# Patient Record
Sex: Male | Born: 1975 | Race: White | Hispanic: No | Marital: Single | Smoking: Never smoker
Health system: Southern US, Community
[De-identification: ages and names within clinical notes are randomized; demographics above are authoritative.]

## PROBLEM LIST (undated history)

## (undated) DIAGNOSIS — I10 Essential (primary) hypertension: Secondary | ICD-10-CM

---

## 2019-09-25 ENCOUNTER — Encounter (HOSPITAL_BASED_OUTPATIENT_CLINIC_OR_DEPARTMENT_OTHER): Payer: Self-pay | Admitting: *Deleted

## 2019-09-25 ENCOUNTER — Emergency Department (HOSPITAL_BASED_OUTPATIENT_CLINIC_OR_DEPARTMENT_OTHER)
Admission: EM | Admit: 2019-09-25 | Discharge: 2019-09-25 | Disposition: A | Discharge status: Home / Self Care | Payer: Self-pay | Attending: Emergency Medicine | Admitting: Emergency Medicine

## 2019-09-25 ENCOUNTER — Other Ambulatory Visit: Payer: Self-pay

## 2019-09-25 ENCOUNTER — Emergency Department (HOSPITAL_BASED_OUTPATIENT_CLINIC_OR_DEPARTMENT_OTHER): Payer: Self-pay

## 2019-09-25 DIAGNOSIS — R079 Chest pain, unspecified: Secondary | ICD-10-CM | POA: Insufficient documentation

## 2019-09-25 DIAGNOSIS — R0789 Other chest pain: Secondary | ICD-10-CM | POA: Insufficient documentation

## 2019-09-25 DIAGNOSIS — R0609 Other forms of dyspnea: Secondary | ICD-10-CM

## 2019-09-25 DIAGNOSIS — I10 Essential (primary) hypertension: Secondary | ICD-10-CM | POA: Insufficient documentation

## 2019-09-25 HISTORY — DX: Essential (primary) hypertension: I10

## 2019-09-25 LAB — CBC
HCT: 41.3 % (ref 39.0–52.0)
Hemoglobin: 13.2 g/dL (ref 13.0–17.0)
MCH: 29.9 pg (ref 26.0–34.0)
MCHC: 32 g/dL (ref 30.0–36.0)
MCV: 93.4 fL (ref 80.0–100.0)
Platelets: 256 10*3/uL (ref 150–400)
RBC: 4.42 MIL/uL (ref 4.22–5.81)
RDW: 12.4 % (ref 11.5–15.5)
WBC: 6.4 10*3/uL (ref 4.0–10.5)
nRBC: 0 % (ref 0.0–0.2)

## 2019-09-25 LAB — BASIC METABOLIC PANEL
Anion gap: 7 (ref 5–15)
BUN: 24 mg/dL — ABNORMAL HIGH (ref 6–20)
CO2: 26 mmol/L (ref 22–32)
Calcium: 8.8 mg/dL — ABNORMAL LOW (ref 8.9–10.3)
Chloride: 104 mmol/L (ref 98–111)
Creatinine, Ser: 1.11 mg/dL (ref 0.61–1.24)
GFR calc Af Amer: >60 mL/min (ref >60)
GFR calc non Af Amer: >60 mL/min (ref >60)
Glucose, Bld: 118 mg/dL — ABNORMAL HIGH (ref 70–99)
Potassium: 4.1 mmol/L (ref 3.5–5.1)
Sodium: 137 mmol/L (ref 135–145)

## 2019-09-25 LAB — TROPONIN I (HIGH SENSITIVITY)
Troponin I (High Sensitivity): 8 ng/L (ref <18)
Troponin I (High Sensitivity): 7 ng/L (ref <18)

## 2019-09-25 MED ORDER — SODIUM CHLORIDE 0.9% FLUSH
3.0000 mL | Freq: Once | INTRAVENOUS | Status: DC
  Filled 2019-09-25: qty 3

## 2019-09-25 MED ORDER — ALBUTEROL SULFATE HFA 108 (90 BASE) MCG/ACT IN AERS
4.0000 | INHALATION_SPRAY | Freq: Once | RESPIRATORY_TRACT | Status: AC
  Administered 2019-09-25: 21:00:00 4 via RESPIRATORY_TRACT
  Filled 2019-09-25: qty 6.7

## 2019-09-25 NOTE — ED Provider Notes (Signed)
[facility] EMERGENCY DEPARTMENT Provider Note   CSN: [id number] Arrival date & time: 09/25/19  1850  History    Chief Complaint  Patient presents with  . Chest Pain  . Shortness of Breath    HPI Patrick Hanson is a 43 y.o. male with PMHx s/f Hypertension, who presents to the ED with chest pain and shortness of breath.  Patient reports that shortness of breath started 4 days ago on 09/22/2019.  He reports that he has had worsening dyspnea on exertion and first noticed when he was winded walking up the stairs.  He reports after he is short of breath he has some midsternal chest pain that goes away with rest.  Patient does report some radiation to bilateral jaws.  Today, he checked his blood pressure and it was 573 systolic, which she says is much higher than it typically is.. Patient was following with his PCP very closely, about monthly, until couple months ago.  He is currently on 10 mg of lisinopril for high blood pressure.  He reports that this this dose of lisinopril has managed his blood pressure well.  He denies any fevers, chills, nausea, vomiting, diaphoresis, abdominal pain, constipation/diarrhea.  He does not have any history of high cholesterol.  Patient does report some fatigue recently.  Allergies: Penicillins Medications:  Current Facility-Administered Medications:  .  sodium chloride flush (NS) 0.9 % injection 3 mL, 3 mL, Intravenous, Once, Fredia Sorrow, MD  Current Outpatient Medications:  .  lisinopril (ZESTRIL) 10 MG tablet, TAKE 1 TABLET BY MOUTH ONCE DAILY., Disp: , Rfl:  .  traZODone (DESYREL) 50 MG tablet, TAKE 3 TABLETS BY MOUTH AT BEDTIME, Disp: , Rfl:    Past Medical/Surgical History Past Medical History:  Diagnosis Date  . Hypertension    There are no active problems to display for this patient.  History reviewed. No pertinent surgical history.    No family history on file. Social History   Tobacco Use  . Smoking status: Never Smoker  .  Smokeless tobacco: Never Used  Substance Use Topics  . Alcohol use: Not Currently  . Drug use: Never   Review of Systems Review of Systems  Constitutional: Positive for fatigue. Negative for chills, diaphoresis and fever.  HENT: Positive for dental problem. Negative for ear pain and sore throat.   Eyes: Negative for pain and visual disturbance.  Respiratory: Positive for chest tightness and shortness of breath. Negative for cough.   Cardiovascular: Negative for chest pain and palpitations.  Gastrointestinal: Negative for abdominal pain, constipation, diarrhea, nausea and vomiting.  Genitourinary: Negative for dysuria and hematuria.  Musculoskeletal: Negative for arthralgias, back pain and gait problem.  Skin: Negative for color change and rash.  Neurological: Positive for headaches. Negative for dizziness, seizures, syncope and light-headedness.  All other systems reviewed and are negative.   Physical Exam Updated Vital Signs BP 136/86   Pulse 68   Temp 98.5 F (36.9 C) (Oral)   Resp 16   Ht 5\' 11"  (1.803 m)   Wt (!) 147.4 kg   SpO2 98%   BMI 45.33 kg/m   Physical Exam  ED Treatments / Results  Labs (all labs ordered are listed, but only abnormal results are displayed) Labs Reviewed  BASIC METABOLIC PANEL - Abnormal; Notable for the following components:      Result Value   Glucose, Bld 118 (*)    BUN 24 (*)    Calcium 8.8 (*)    All other components within normal  limits  CBC  TROPONIN I (HIGH SENSITIVITY)  TROPONIN I (HIGH SENSITIVITY)    EKG EKG Interpretation  Date/Time:  Thursday September 25 2019 19:00:07 EDT Ventricular Rate:  73 PR Interval:  172 QRS Duration: 96 QT Interval:  390 QTC Calculation: 429 R Axis:   -23 Text Interpretation:  Normal sinus rhythm Cannot rule out Anterior infarct , age undetermined Abnormal ECG Artifact Confirmed by Vanetta MuldersZackowski, Scott [phone]([id number]) on 09/25/2019 8:12:40 PM   Radiology Dg Chest 2 View  Result Date: 09/25/2019  CLINICAL DATA:  Short of breath for 4 days. Chest pain. EXAM: CHEST - 2 VIEW COMPARISON:  None. FINDINGS: Both views are degraded by patient body habitus. Lateral view degraded by patient arm position. Midline trachea. Mild cardiomegaly. No pleural effusion or pneumothorax. Mild interstitial prominence is felt to be due to relatively low lung volumes on the frontal radiograph. No well-defined lobar consolidation. IMPRESSION: 1. Mild cardiomegaly, without congestive failure. 2. Mild degradation secondary to patient body habitus. Electronically Signed   By: Jeronimo GreavesKyle  Talbot M.D.   On: 09/25/2019 19:40    Procedures Procedures (including critical care time)  Medications Ordered in ED Medications  sodium chloride flush (NS) 0.9 % injection 3 mL (3 mLs Intravenous Not Given 09/25/19 1923)  albuterol (VENTOLIN HFA) 108 (90 Base) MCG/ACT inhaler 4 puff (4 puffs Inhalation Given 09/25/19 2044)    Initial Impression / Assessment and Plan / ED Course  I have reviewed the triage vital signs and the nursing notes. Pertinent labs & imaging results that were available during my care of the patient were reviewed by me and considered in my medical decision making (see chart for details). Patient's presentation consistent with possible ACS given chest pain and shortness of breath on exertion and relief with rest.  Patient's risk factors include obesity, hypertension.  His last lipid panel was within normal limits in December 2019.  Patient's heart score is 3.  Initial work-up shows normal BMP and CBC, troponin of 8 and chest x-ray with low lung volumes.  Patient does report that he does have some audible wheezing but has never been diagnosed with asthma in the past.  Will trial for possible albuterol to see if there is any improvement with breathing.  It is unlikely that patient has shortness of breath due to infectious etiology as he does not have any cough, URI symptoms or fever.  10:24 PM Patient's physical exam  improved with albuterol.  I do not hear any wheezing.  He subjectively feels a little bit better.  But he typically has had shortness of breath when he is moving around.  Patient second troponin came back negative.  Patient has not had any chest pain since coming to the ED.  Patient's blood pressures have also improved to 130s over 70s.  Encourage patient to follow-up with PCP for further work-up.  If albuterol helps to improve patient's breathing on exertion, he should follow-up for further evaluation of asthma.   Patient reports having difficulty scheduling with PCP as he had currently has no insurance since he was laid off due to Covid.  [facility].    Final Clinical Impressions(s) / ED Diagnoses   Final diagnoses:  Dyspnea on exertion  Chest pain in adult   ED Discharge Orders    None     Disposition: home w/ follow up with PCP  Melene Planachel E. Areana Kosanke, M.D. FM PGY-2  Melene Plan, MD 09/25/19 2224    Vanetta Mulders, MD 09/25/19 2227

## 2019-09-25 NOTE — ED Notes (Signed)
Pt. Reports he has felt a tightness in his chest and jaw pain in the past week with relief and then off and on pain again.  Pt. Reports shob with exertion needing to rest 5-10 mins before going again.

## 2019-09-25 NOTE — ED Triage Notes (Signed)
Sob x 3 days. Tightness in his chest today. Fatigue and HTN.

## 2019-09-25 NOTE — Discharge Instructions (Addendum)
Please follow up with your primary care provider for today's emergency visit.  According to your labs and imaging today, it does not appear that you had heart attack.  However, given your risk factors of high blood pressure and obesity, this does not rule out further cardiac disease in the future.   Remember to try to use your albuterol when you feel short of breath to see if it has any improvement.  If it does, you should follow-up with your doctor for possible asthma evaluation. If you experience any continued chest pain, vomiting, diffuse sweating, please return to the emergency department for further work-up.

## 2020-05-13 MED FILL — PHENTERMINE 37.5 MG TABLET: 37.5 | 30 days supply | Qty: 30 | Fill #0

## 2020-05-13 MED FILL — TOPIRAMATE 25 MG TAB: 25 | 30 days supply | Qty: 30 | Fill #0

## 2020-06-10 MED FILL — PHENTERMINE 37.5 MG TABLET: 37.5 | 30 days supply | Qty: 30 | Fill #0

## 2020-07-08 MED FILL — PHENTERMINE 37.5 MG TABLET: 37.5 | 30 days supply | Qty: 30 | Fill #0

## 2021-03-29 ENCOUNTER — Emergency Department (HOSPITAL_BASED_OUTPATIENT_CLINIC_OR_DEPARTMENT_OTHER): Payer: Self-pay

## 2021-03-29 ENCOUNTER — Other Ambulatory Visit: Payer: Self-pay

## 2021-03-29 ENCOUNTER — Emergency Department (HOSPITAL_BASED_OUTPATIENT_CLINIC_OR_DEPARTMENT_OTHER)
Admission: EM | Admit: 2021-03-29 | Discharge: 2021-03-29 | Disposition: A | Discharge status: Home / Self Care | Payer: Self-pay | Attending: Emergency Medicine | Admitting: Emergency Medicine

## 2021-03-29 ENCOUNTER — Encounter (HOSPITAL_BASED_OUTPATIENT_CLINIC_OR_DEPARTMENT_OTHER): Payer: Self-pay | Admitting: *Deleted

## 2021-03-29 DIAGNOSIS — R197 Diarrhea, unspecified: Secondary | ICD-10-CM

## 2021-03-29 DIAGNOSIS — Z79899 Other long term (current) drug therapy: Secondary | ICD-10-CM | POA: Insufficient documentation

## 2021-03-29 DIAGNOSIS — R599 Enlarged lymph nodes, unspecified: Secondary | ICD-10-CM

## 2021-03-29 DIAGNOSIS — R110 Nausea: Secondary | ICD-10-CM

## 2021-03-29 DIAGNOSIS — M545 Low back pain, unspecified: Secondary | ICD-10-CM | POA: Insufficient documentation

## 2021-03-29 DIAGNOSIS — I10 Essential (primary) hypertension: Secondary | ICD-10-CM | POA: Insufficient documentation

## 2021-03-29 DIAGNOSIS — R109 Unspecified abdominal pain: Secondary | ICD-10-CM

## 2021-03-29 DIAGNOSIS — R10814 Left lower quadrant abdominal tenderness: Secondary | ICD-10-CM | POA: Insufficient documentation

## 2021-03-29 LAB — DIFFERENTIAL
Abs Immature Granulocytes: 0.03 10*3/uL (ref 0.00–0.07)
Basophils Absolute: 0 10*3/uL (ref 0.0–0.1)
Basophils Relative: 0 %
Eosinophils Absolute: 0.1 10*3/uL (ref 0.0–0.5)
Eosinophils Relative: 1 %
Immature Granulocytes: 0 %
Lymphocytes Relative: 25 %
Lymphs Abs: 2.2 10*3/uL (ref 0.7–4.0)
Monocytes Absolute: 0.6 10*3/uL (ref 0.1–1.0)
Monocytes Relative: 7 %
Neutro Abs: 5.7 10*3/uL (ref 1.7–7.7)
Neutrophils Relative %: 67 %

## 2021-03-29 LAB — CBC
HCT: 43.2 % (ref 39.0–52.0)
Hemoglobin: 14.1 g/dL (ref 13.0–17.0)
MCH: 29 pg (ref 26.0–34.0)
MCHC: 32.6 g/dL (ref 30.0–36.0)
MCV: 88.7 fL (ref 80.0–100.0)
Platelets: 327 10*3/uL (ref 150–400)
RBC: 4.87 MIL/uL (ref 4.22–5.81)
RDW: 13.1 % (ref 11.5–15.5)
WBC: 8.4 10*3/uL (ref 4.0–10.5)
nRBC: 0 % (ref 0.0–0.2)

## 2021-03-29 LAB — COMPREHENSIVE METABOLIC PANEL
ALT: 74 U/L — ABNORMAL HIGH (ref 0–44)
AST: 36 U/L (ref 15–41)
Albumin: 3.8 g/dL (ref 3.5–5.0)
Alkaline Phosphatase: 86 U/L (ref 38–126)
Anion gap: 10 (ref 5–15)
BUN: 13 mg/dL (ref 6–20)
CO2: 24 mmol/L (ref 22–32)
Calcium: 8.5 mg/dL — ABNORMAL LOW (ref 8.9–10.3)
Chloride: 102 mmol/L (ref 98–111)
Creatinine, Ser: 0.84 mg/dL (ref 0.61–1.24)
GFR, Estimated: >60 mL/min (ref >60)
Glucose, Bld: 116 mg/dL — ABNORMAL HIGH (ref 70–99)
Potassium: 3.7 mmol/L (ref 3.5–5.1)
Sodium: 136 mmol/L (ref 135–145)
Total Bilirubin: 0.2 mg/dL — ABNORMAL LOW (ref 0.3–1.2)
Total Protein: 7.3 g/dL (ref 6.5–8.1)

## 2021-03-29 LAB — URINALYSIS, ROUTINE W REFLEX MICROSCOPIC
Bilirubin Urine: NEGATIVE
Glucose, UA: NEGATIVE mg/dL
Hgb urine dipstick: NEGATIVE
Ketones, ur: NEGATIVE mg/dL
Leukocytes,Ua: NEGATIVE
Nitrite: NEGATIVE
Protein, ur: NEGATIVE mg/dL
Specific Gravity, Urine: >1.03 — ABNORMAL HIGH (ref 1.005–1.030)
pH: 6 (ref 5.0–8.0)

## 2021-03-29 LAB — LIPASE, BLOOD: Lipase: 44 U/L (ref 11–51)

## 2021-03-29 MED ORDER — DICYCLOMINE HCL 20 MG PO TABS: 20.0000 mg | ORAL_TABLET | Freq: Two times a day (BID) | ORAL | 0 refills | Status: AC | PRN

## 2021-03-29 MED ORDER — ONDANSETRON HCL 4 MG PO TABS: 4.0000 mg | ORAL_TABLET | Freq: Three times a day (TID) | ORAL | 0 refills | Status: AC | PRN

## 2021-03-29 MED ORDER — IOHEXOL 300 MG/ML  SOLN
100.0000 mL | Freq: Once | INTRAMUSCULAR | Status: AC | PRN
  Administered 2021-03-29: 100 mL via INTRAVENOUS

## 2021-03-29 MED ORDER — MORPHINE SULFATE (PF) 4 MG/ML IV SOLN
4.0000 mg | Freq: Once | INTRAVENOUS | Status: AC
  Administered 2021-03-29: 4 mg via INTRAVENOUS
  Filled 2021-03-29: qty 1

## 2021-03-29 MED ORDER — SODIUM CHLORIDE 0.9 % IV BOLUS
1000.0000 mL | Freq: Once | INTRAVENOUS | Status: AC
  Administered 2021-03-29: 1000 mL via INTRAVENOUS

## 2021-03-29 MED ORDER — ONDANSETRON HCL 4 MG/2ML IJ SOLN
4.0000 mg | Freq: Once | INTRAMUSCULAR | Status: AC
  Administered 2021-03-29: 4 mg via INTRAVENOUS
  Filled 2021-03-29: qty 2

## 2021-03-29 NOTE — ED Notes (Signed)
Patient transported to CT 

## 2021-03-29 NOTE — ED Provider Notes (Signed)
[facility] EMERGENCY DEPARTMENT Provider Note   CSN: [id number] Arrival date & time: 03/29/21  1906     History Chief Complaint  Patient presents with  . Back Pain  . Emesis    Patrick Hanson is a 45 y.o. male presenting for evaluation of nausea, abdominal pain, diarrhea.  Patient states that the past week he has had persistent diarrhea.  Approximately 10 episodes a day.  He has had associated nausea, but no vomiting.  He reports chills.  Over the past 4 days, he has felt worse, stating he is feeling fatigued and overall weak.  He is also now having intermittent sharp spasm pain, mostly in the left side of his abdomen.  He denies history of similar.  No history of abdominal problems, has had hernia repair surgery but no other abdominal surgeries.  He denies chest pain, shortness of breath, cough, urinary symptoms.  No blood in his stool.  He is not taking anything for his symptoms.  He has a history of hypertension and anxiety, no other medical problems.  HPI     Past Medical History:  Diagnosis Date  . Hypertension     There are no problems to display for this patient.   History reviewed. No pertinent surgical history.     No family history on file.  Social History   Tobacco Use  . Smoking status: Never Smoker  . Smokeless tobacco: Never Used  Substance Use Topics  . Alcohol use: Not Currently  . Drug use: Never    Home Medications Prior to Admission medications   Medication Sig Start Date End Date Taking? Authorizing Provider  citalopram (CELEXA) 10 MG tablet Take by mouth. 09/22/20  Yes [provider]  dicyclomine (BENTYL) 20 MG tablet Take 1 tablet (20 mg total) by mouth 2 (two) times daily as needed for spasms. 03/29/21  Yes Shakeira Rhee, PA-C  lisinopril (ZESTRIL) 10 MG tablet TAKE 1 TABLET BY MOUTH ONCE DAILY. 08/22/17  Yes [provider]  ondansetron (ZOFRAN) 4 MG tablet Take 1 tablet (4 mg total) by mouth every 8 (eight)  hours as needed for nausea or vomiting. 03/29/21  Yes Shah Insley, PA-C  traZODone (DESYREL) 50 MG tablet TAKE 3 TABLETS BY MOUTH AT BEDTIME 06/27/18  Yes [provider]    Allergies    Penicillins  Review of Systems   Review of Systems  Gastrointestinal: Positive for abdominal pain, diarrhea and nausea.  Neurological: Positive for weakness.  All other systems reviewed and are negative.   Physical Exam Updated Vital Signs BP (!) 146/101   Pulse 75   Temp 98.4 F (36.9 C) (Oral)   Resp 18   Ht 5\' 11"  (1.803 m)   Wt (!) 163.3 kg   SpO2 98%   BMI 50.21 kg/m   Physical Exam Vitals and nursing note reviewed.  Constitutional:      General: He is not in acute distress.    Appearance: He is well-developed. He is obese.     Comments: Resting in the bed in no acute distress  HENT:     Head: Normocephalic and atraumatic.  Eyes:     Conjunctiva/sclera: Conjunctivae normal.     Pupils: Pupils are equal, round, and reactive to light.  Cardiovascular:     Rate and Rhythm: Normal rate and regular rhythm.     Pulses: Normal pulses.  Pulmonary:     Effort: Pulmonary effort is normal. No respiratory distress.     Breath sounds: Normal  breath sounds. No wheezing.  Abdominal:     General: There is no distension.     Palpations: Abdomen is soft. There is no mass.     Tenderness: There is abdominal tenderness. There is no guarding or rebound.     Comments: Tenderness palpation of left lower quadrant abdomen.  No rigidity, guarding, syndrome.  Negative rebound.  No peritonitis.  No CVA tenderness.  Musculoskeletal:        General: Tenderness present. Normal range of motion.     Cervical back: Normal range of motion and neck supple.     Comments: Mild tenderness palpation of right low back musculature.  No pain over midline spine.  Skin:    General: Skin is warm and dry.     Capillary Refill: Capillary refill takes less than 2 seconds.  Neurological:     Mental Status:  He is alert and oriented to person, place, and time.     ED Results / Procedures / Treatments   Labs (all labs ordered are listed, but only abnormal results are displayed) Labs Reviewed  COMPREHENSIVE METABOLIC PANEL - Abnormal; Notable for the following components:      Result Value   Glucose, Bld 116 (*)    Calcium 8.5 (*)    ALT 74 (*)    Total Bilirubin 0.2 (*)    All other components within normal limits  URINALYSIS, ROUTINE W REFLEX MICROSCOPIC - Abnormal; Notable for the following components:   Specific Gravity, Urine >1.030 (*)    All other components within normal limits  LIPASE, BLOOD  CBC  DIFFERENTIAL    EKG None  Radiology CT ABDOMEN PELVIS W CONTRAST  Result Date: 03/29/2021 CLINICAL DATA:  Diverticulitis suspected. Abdominal pain and back pain with vomiting for a week. EXAM: CT ABDOMEN AND PELVIS WITH CONTRAST TECHNIQUE: Multidetector CT imaging of the abdomen and pelvis was performed using the standard protocol following bolus administration of intravenous contrast. CONTRAST:  OMNIPAQUE IOHEXOL 300 MG/ML  SOLN COMPARISON:  None. FINDINGS: Lower chest: No acute abnormality. Hepatobiliary: No focal liver abnormality. No gallstones, gallbladder wall thickening, or pericholecystic fluid. No biliary dilatation. Pancreas: Question peripancreatic lymph node versus exophytic pancreatic lesion arising from the pancreatic head (2:34). Normal pancreatic contour. No surrounding inflammatory changes. No main pancreatic ductal dilatation. Spleen: Normal in size without focal abnormality. Adrenals/Urinary Tract: No adrenal nodule bilaterally. Bilateral kidneys enhance symmetrically. No hydronephrosis. No hydroureter. The urinary bladder is unremarkable. Stomach/Bowel: Stomach is within normal limits. No evidence of bowel wall thickening or dilatation. Appendix appears normal. Vascular/Lymphatic: No abdominal aorta or iliac aneurysm. No abdominal, pelvic, or inguinal  lymphadenopathy. Reproductive: Prostate is unremarkable. Other: No intraperitoneal free fluid. No intraperitoneal free gas. No organized fluid collection. Musculoskeletal: Fat containing left inguinal hernia. No suspicious lytic or blastic osseous lesions. No acute displaced fracture. Multilevel degenerative changes of the spine. IMPRESSION: Couple of enlarged upper abdominal lymph nodes with subjacent vague misty mesentery surrounding the superior mesenteric vessels. The peripancreatic lymph node could also represent an exophytic pancreatic lesion. Concern for metastatic malignancy or lymphoma. Electronically Signed   By: Tish Frederickson M.D.   On: 03/29/2021 21:47    Procedures Procedures   Medications Ordered in ED Medications  sodium chloride 0.9 % bolus 1,000 mL (0 mLs Intravenous Stopped 03/29/21 2120)  ondansetron (ZOFRAN) injection 4 mg (4 mg Intravenous Given 03/29/21 2014)  morphine 4 MG/ML injection 4 mg (4 mg Intravenous Given 03/29/21 2014)  iohexol (OMNIPAQUE) 300 MG/ML solution 100 mL (100  mLs Intravenous Contrast Given 03/29/21 2119)    ED Course  I have reviewed the triage vital signs and the nursing notes.  Pertinent labs & imaging results that were available during my care of the patient were reviewed by me and considered in my medical decision making (see chart for details).    MDM Rules/Calculators/A&P                          Patient presenting for evaluation of nausea, abdominal pain, diarrhea.  On exam, patient appears nontoxic.  He does have tenderness palpation of the left lower quadrant.  Concern for possible diverticulitis.  Also consider viral GI illness.  Less likely kidney stone, patient without CVA tenderness.  Less likely UTI, patient without urinary symptoms.  Will obtain labs, CT abdomen pelvis, treat symptomatically, and reassess.  Labs interpreted by me, overall reassuring.  Kidney, liver, pancreatic function normal.  No leukocytosis.  Electrolytes stable.   CT abdomen pelvis negative for acute findings other than possible enlarged lymph nodes.  In the setting of what is likely a viral GI illness, this is likely reactive lymphadenopathy.  However discussed findings with patient and importance of close follow-up with PCP to ensure resolution of lymphadenopathy.  On reassessment, patient reports significant improvement of symptoms with medications.  I discussed continued symptomatic treatment at home.  At this time, patient appears safe for discharge.  Return precautions given.  Patient states he understands and agrees to plan.   Final Clinical Impression(s) / ED Diagnoses Final diagnoses:  Diarrhea, unspecified type  Nausea  Intermittent abdominal pain  Enlarged lymph node    Rx / DC Orders ED Discharge Orders         Ordered    ondansetron (ZOFRAN) 4 MG tablet  Every 8 hours PRN        03/29/21 2304    dicyclomine (BENTYL) 20 MG tablet  2 times daily PRN        03/29/21 2304           Itzy Adler, PA-C 03/29/21 2309    Gwyneth Sprout, MD 03/29/21 2335

## 2021-03-29 NOTE — ED Triage Notes (Signed)
Abdominal pain, back pain and vomiting for a week.

## 2021-03-29 NOTE — ED Notes (Signed)
Pt placed in a gown and hooked up to the monitor with the BP cuff and pulse ox 

## 2021-03-29 NOTE — Discharge Instructions (Addendum)
Use Zofran as needed for nausea or vomiting. Use Bentyl as needed for abdominal pain/spasm/cramp. Use Tylenol or ibuprofen as needed for further pain control. Make sure you are staying well-hydrated with water. There is information in the paperwork about foods that may help and hurt your diarrhea. It is important that you follow-up with your primary care doctor for recheck of your symptoms and for repeat evaluation of the enlarged lymph nodes that were identified today. Return to the emergency room if develop high fevers, persistent vomiting, severe worsening abdominal pain, or any new, worsening, concerning symptoms.

## 2021-10-11 ENCOUNTER — Encounter (HOSPITAL_BASED_OUTPATIENT_CLINIC_OR_DEPARTMENT_OTHER): Payer: Self-pay | Admitting: Emergency Medicine

## 2021-10-11 ENCOUNTER — Emergency Department (HOSPITAL_BASED_OUTPATIENT_CLINIC_OR_DEPARTMENT_OTHER): Payer: Self-pay

## 2021-10-11 ENCOUNTER — Other Ambulatory Visit: Payer: Self-pay

## 2021-10-11 ENCOUNTER — Emergency Department (HOSPITAL_BASED_OUTPATIENT_CLINIC_OR_DEPARTMENT_OTHER)
Admission: EM | Admit: 2021-10-11 | Discharge: 2021-10-11 | Disposition: A | Discharge status: Home / Self Care | Payer: Self-pay | Attending: Emergency Medicine | Admitting: Emergency Medicine

## 2021-10-11 DIAGNOSIS — R7401 Elevation of levels of liver transaminase levels: Secondary | ICD-10-CM | POA: Insufficient documentation

## 2021-10-11 DIAGNOSIS — R062 Wheezing: Secondary | ICD-10-CM

## 2021-10-11 DIAGNOSIS — J40 Bronchitis, not specified as acute or chronic: Secondary | ICD-10-CM

## 2021-10-11 DIAGNOSIS — J3489 Other specified disorders of nose and nasal sinuses: Secondary | ICD-10-CM | POA: Insufficient documentation

## 2021-10-11 DIAGNOSIS — J209 Acute bronchitis, unspecified: Secondary | ICD-10-CM | POA: Insufficient documentation

## 2021-10-11 DIAGNOSIS — I10 Essential (primary) hypertension: Secondary | ICD-10-CM | POA: Insufficient documentation

## 2021-10-11 DIAGNOSIS — R197 Diarrhea, unspecified: Secondary | ICD-10-CM | POA: Insufficient documentation

## 2021-10-11 DIAGNOSIS — R7989 Other specified abnormal findings of blood chemistry: Secondary | ICD-10-CM

## 2021-10-11 DIAGNOSIS — Z79899 Other long term (current) drug therapy: Secondary | ICD-10-CM | POA: Insufficient documentation

## 2021-10-11 DIAGNOSIS — Z20822 Contact with and (suspected) exposure to covid-19: Secondary | ICD-10-CM | POA: Insufficient documentation

## 2021-10-11 LAB — CBC WITH DIFFERENTIAL/PLATELET
Abs Immature Granulocytes: 0.01 10*3/uL (ref 0.00–0.07)
Basophils Absolute: 0 10*3/uL (ref 0.0–0.1)
Basophils Relative: 0 %
Eosinophils Absolute: 0.1 10*3/uL (ref 0.0–0.5)
Eosinophils Relative: 2 %
HCT: 41 % (ref 39.0–52.0)
Hemoglobin: 13.4 g/dL (ref 13.0–17.0)
Immature Granulocytes: 0 %
Lymphocytes Relative: 35 %
Lymphs Abs: 1.5 10*3/uL (ref 0.7–4.0)
MCH: 29.3 pg (ref 26.0–34.0)
MCHC: 32.7 g/dL (ref 30.0–36.0)
MCV: 89.5 fL (ref 80.0–100.0)
Monocytes Absolute: 0.5 10*3/uL (ref 0.1–1.0)
Monocytes Relative: 10 %
Neutro Abs: 2.3 10*3/uL (ref 1.7–7.7)
Neutrophils Relative %: 53 %
Platelets: 248 10*3/uL (ref 150–400)
RBC: 4.58 MIL/uL (ref 4.22–5.81)
RDW: 13.4 % (ref 11.5–15.5)
WBC: 4.3 10*3/uL (ref 4.0–10.5)
nRBC: 0 % (ref 0.0–0.2)

## 2021-10-11 LAB — COMPREHENSIVE METABOLIC PANEL
ALT: 391 U/L — ABNORMAL HIGH (ref 0–44)
AST: 162 U/L — ABNORMAL HIGH (ref 15–41)
Albumin: 3.6 g/dL (ref 3.5–5.0)
Alkaline Phosphatase: 173 U/L — ABNORMAL HIGH (ref 38–126)
Anion gap: 6 (ref 5–15)
BUN: 15 mg/dL (ref 6–20)
CO2: 23 mmol/L (ref 22–32)
Calcium: 8.1 mg/dL — ABNORMAL LOW (ref 8.9–10.3)
Chloride: 109 mmol/L (ref 98–111)
Creatinine, Ser: 1.04 mg/dL (ref 0.61–1.24)
GFR, Estimated: >60 mL/min (ref >60)
Glucose, Bld: 116 mg/dL — ABNORMAL HIGH (ref 70–99)
Potassium: 3.9 mmol/L (ref 3.5–5.1)
Sodium: 138 mmol/L (ref 135–145)
Total Bilirubin: 0.6 mg/dL (ref 0.3–1.2)
Total Protein: 6.8 g/dL (ref 6.5–8.1)

## 2021-10-11 LAB — RESP PANEL BY RT-PCR (FLU A&B, COVID) ARPGX2
Influenza A by PCR: NEGATIVE
Influenza B by PCR: NEGATIVE
SARS Coronavirus 2 by RT PCR: NEGATIVE

## 2021-10-11 LAB — BRAIN NATRIURETIC PEPTIDE: B Natriuretic Peptide: 27.5 pg/mL (ref 0.0–100.0)

## 2021-10-11 LAB — LIPASE, BLOOD: Lipase: 40 U/L (ref 11–51)

## 2021-10-11 MED ORDER — DOXYCYCLINE HYCLATE 100 MG PO CAPS: 100.0000 mg | ORAL_CAPSULE | Freq: Two times a day (BID) | ORAL | 0 refills | Status: AC

## 2021-10-11 MED ORDER — IPRATROPIUM-ALBUTEROL 0.5-2.5 (3) MG/3ML IN SOLN
3.0000 mL | Freq: Once | RESPIRATORY_TRACT | Status: AC
  Administered 2021-10-11: 3 mL via RESPIRATORY_TRACT
  Filled 2021-10-11: qty 3

## 2021-10-11 MED ORDER — METHYLPREDNISOLONE SODIUM SUCC 125 MG IJ SOLR
125.0000 mg | Freq: Once | INTRAMUSCULAR | Status: AC
  Administered 2021-10-11: 125 mg via INTRAVENOUS
  Filled 2021-10-11: qty 2

## 2021-10-11 MED ORDER — ATROVENT HFA 17 MCG/ACT IN AERS: 2.0000 | INHALATION_SPRAY | RESPIRATORY_TRACT | 12 refills | Status: AC | PRN

## 2021-10-11 MED ORDER — ALBUTEROL SULFATE HFA 108 (90 BASE) MCG/ACT IN AERS: 1.0000 | INHALATION_SPRAY | Freq: Four times a day (QID) | RESPIRATORY_TRACT | 0 refills | Status: AC | PRN

## 2021-10-11 MED ORDER — PREDNISONE 50 MG PO TABS: 50.0000 mg | ORAL_TABLET | Freq: Every day | ORAL | 0 refills | Status: AC

## 2021-10-11 NOTE — Discharge Instructions (Addendum)
This is likely bronchitis.  I prescribed you for a few medications.  Take as prescribed.  Follow Up with primary care provider in 24 hours or return here to the emergency department for new or worsening symptoms.  As discussed in room you have elevated liver function test.  Please call your primary care for evaluation of this

## 2021-10-11 NOTE — ED Provider Notes (Signed)
[facility] EMERGENCY DEPARTMENT Provider Note   CSN: [id number] Arrival date & time: 10/11/21  3818    History Chief Complaint  Patient presents with   Shortness of Breath    Patrick Hanson is a 45 y.o. male with past medical history significant for obesity, hypertension who presents for evaluation of shortness of breath.  Began 3 days ago.  Family members had similar symptoms at onset however family sx resolved and his did not.  States he has had some chills, nausea, diarrhea, generalized myalgias, generalized weakness as well as shortness of breath and significant wheezing.  He states his wheeze is worse when he ambulates and moves.  He denies any prior history of asthma, COPD, OSA, aneurysm, heart failure.  Does feel short of breath when he lays flat.  Admits to some congestion, rhinorrhea and postnasal drip.  He is coughing up yellow sputum. No PND, Orthopnea at baseline.  No CP, Back pain, abd pain, unilateral weakness. Has not taken anything for his symptoms.  Denies additional aggravating or alleviating factors.  History obtained from patient and past medical records.  No interpreter is used.  HPI     Past Medical History:  Diagnosis Date   Hypertension     There are no problems to display for this patient.   History reviewed. No pertinent surgical history.     No family history on file.  Social History   Tobacco Use   Smoking status: Never   Smokeless tobacco: Never  Substance Use Topics   Alcohol use: Not Currently   Drug use: Never    Home Medications Prior to Admission medications   Medication Sig Start Date End Date Taking? Authorizing Provider  albuterol (VENTOLIN HFA) 108 (90 Base) MCG/ACT inhaler Inhale 1-2 puffs into the lungs every 6 (six) hours as needed for wheezing or shortness of breath. 10/11/21  Yes Abie Cheek A, PA-C  doxycycline (VIBRAMYCIN) 100 MG capsule Take 1 capsule (100 mg total) by mouth 2 (two) times daily. 10/11/21  Yes  Donevan Biller A, PA-C  ipratropium (ATROVENT HFA) 17 MCG/ACT inhaler Inhale 2 puffs into the lungs every 4 (four) hours as needed for wheezing. 10/11/21  Yes Cindel Daugherty A, PA-C  predniSONE (DELTASONE) 50 MG tablet Take 1 tablet (50 mg total) by mouth daily for 4 days. 10/11/21 10/15/21 Yes Ramonia Mcclaran A, PA-C  citalopram (CELEXA) 10 MG tablet Take by mouth. 09/22/20   [provider]  dicyclomine (BENTYL) 20 MG tablet Take 1 tablet (20 mg total) by mouth 2 (two) times daily as needed for spasms. 03/29/21   Caccavale, Sophia, PA-C  lisinopril (ZESTRIL) 10 MG tablet TAKE 1 TABLET BY MOUTH ONCE DAILY. 08/22/17   [provider]  ondansetron (ZOFRAN) 4 MG tablet Take 1 tablet (4 mg total) by mouth every 8 (eight) hours as needed for nausea or vomiting. 03/29/21   Caccavale, Sophia, PA-C  traZODone (DESYREL) 50 MG tablet TAKE 3 TABLETS BY MOUTH AT BEDTIME 06/27/18   [provider]    Allergies    Penicillins  Review of Systems   Review of Systems  Constitutional:  Positive for activity change, appetite change, fatigue and fever.  HENT:  Positive for congestion, postnasal drip and rhinorrhea. Negative for sore throat, trouble swallowing and voice change.   Respiratory:  Positive for cough, shortness of breath and wheezing. Negative for chest tightness and stridor.   Cardiovascular: Negative.   Gastrointestinal:  Positive for diarrhea and nausea. Negative for abdominal distention, abdominal  pain, constipation, rectal pain and vomiting.  Genitourinary: Negative.   Musculoskeletal:  Positive for myalgias. Negative for arthralgias, back pain, gait problem, joint swelling, neck pain and neck stiffness.  Skin: Negative.   Neurological:  Positive for weakness (Generalized). Negative for dizziness, seizures, speech difficulty, light-headedness, numbness and headaches.  All other systems reviewed and are negative.  Physical Exam Updated Vital Signs BP (!) 146/83    Pulse 70   Temp 98.1 F (36.7 C) (Oral)   Resp 20   Ht 5\' 11"  (1.803 m)   Wt (!) 158.8 kg   SpO2 95%   BMI 48.82 kg/m   Physical Exam Vitals and nursing note reviewed.  Constitutional:      General: He is not in acute distress.    Appearance: He is well-developed. He is obese. He is not ill-appearing, toxic-appearing or diaphoretic.  HENT:     Head: Normocephalic and atraumatic.     Mouth/Throat:     Mouth: Mucous membranes are moist.  Eyes:     Pupils: Pupils are equal, round, and reactive to light.  Cardiovascular:     Rate and Rhythm: Normal rate and regular rhythm.     Pulses: Normal pulses.          Radial pulses are 2+ on the right side and 2+ on the left side.       Dorsalis pedis pulses are 2+ on the right side and 2+ on the left side.     Heart sounds: Normal heart sounds.  Pulmonary:     Effort: Pulmonary effort is normal. No respiratory distress.     Comments: Speaks in full sentences without difficulty however diffuse expiratory wheeze. Chest:     Comments: Nontender Abdominal:     General: Bowel sounds are normal. There is no distension.     Palpations: Abdomen is soft.     Comments: Soft, nontender.  No rebound or guarding.  Old keloid scars to abdomen  Musculoskeletal:        General: Normal range of motion.     Cervical back: Normal range of motion and neck supple.     Right lower leg: No tenderness. No edema.     Left lower leg: No tenderness. No edema.     Comments: No bony tenderness.  Moves all 4 extremities at difficulty.  Compartments soft.  No lower extremity edema  Skin:    General: Skin is warm and dry.     Capillary Refill: Capillary refill takes less than 2 seconds.     Comments: No rashes or lesions  Neurological:     General: No focal deficit present.     Mental Status: He is alert and oriented to person, place, and time.     Comments: CN 2-12 grossly intact. Ambulatory without difficulty   ED Results / Procedures / Treatments    Labs (all labs ordered are listed, but only abnormal results are displayed) Labs Reviewed  COMPREHENSIVE METABOLIC PANEL - Abnormal; Notable for the following components:      Result Value   Glucose, Bld 116 (*)    Calcium 8.1 (*)    AST 162 (*)    ALT 391 (*)    Alkaline Phosphatase 173 (*)    All other components within normal limits  RESP PANEL BY RT-PCR (FLU A&B, COVID) ARPGX2  CBC WITH DIFFERENTIAL/PLATELET  LIPASE, BLOOD  BRAIN NATRIURETIC PEPTIDE    EKG EKG Interpretation  Date/Time:  Tuesday October 11 2021 09:56:20 EDT Ventricular Rate:  73 PR Interval:  161 QRS Duration: 100 QT Interval:  401 QTC Calculation: 442 R Axis:   -3 Text Interpretation: Sinus rhythm Confirmed by Virgina Norfolk ([id number]) on 10/11/2021 10:00:13 AM  Radiology DG Chest Portable 1 View  Result Date: 10/11/2021 CLINICAL DATA:  Cough and dyspnea EXAM: PORTABLE CHEST 1 VIEW COMPARISON:  09/25/2019 chest radiograph. FINDINGS: Stable cardiomediastinal silhouette with mild cardiomegaly. No pneumothorax. No pleural effusion. No overt pulmonary edema. No acute consolidative airspace disease. IMPRESSION: Stable mild cardiomegaly without overt pulmonary edema. No active pulmonary disease. Electronically Signed   By: Delbert Phenix M.D.   On: 10/11/2021 10:47    Procedures Procedures   Medications Ordered in ED Medications  methylPREDNISolone sodium succinate (SOLU-MEDROL) 125 mg/2 mL injection 125 mg (125 mg Intravenous Given 10/11/21 1039)  ipratropium-albuterol (DUONEB) 0.5-2.5 (3) MG/3ML nebulizer solution 3 mL (3 mLs Nebulization Given 10/11/21 1022)  ipratropium-albuterol (DUONEB) 0.5-2.5 (3) MG/3ML nebulizer solution 3 mL (3 mLs Nebulization Given 10/11/21 1123)   ED Course  I have reviewed the triage vital signs and the nursing notes.  Pertinent labs & imaging results that were available during my care of the patient were reviewed by me and considered in my medical decision making (see chart for  details).  Here for evaluation multiple complaints. He is afebrile, nonseptic, not ill-appearing.  Does have diffuse expiratory wheeze however no hypoxia.  He is in dyspnea on exertion which she relates to his wheeze however no pleuritic or exertional chest pain.  He is not appear grossly fluid overloaded.  He is PERC negative, Wells criteria low risk.  Normal musculoskeletal exam.  Neurovascularly intact.  Some myalgias, congestion, cough, diarrhea with family with similar symptoms.  Plan labs, imaging, symptomatic management and reassess.  Labs and imaging personally reviewed and interpreted:  CBC without leukocytosis 9 lipase 40 BNP 27 Metabolic panel with elevated LFTs however no elevation bilirubin Chest x-ray was stable mild cardiomegaly however no infiltrates, edema COVID, flu pending  Patient reassessed.  He feels significantly improved after nebulizers and steroids.  Highly suspect bronchitis cause of his symptoms.  Low suspicion for acute PE, dissection, ACS, acute CHF with edema.  He does have some elevated LFTs he has no abdominal pain.  We will refer him back to his PCP for further evaluation of this.  Question fatty liver?  Low suspicion for acute cholecystitis, choledocholithiasis, cholangitis, acute hepatitis. Will DC home with symptomatic management and close PCP FU.  He was ambulatory here without any hypoxia.  Discussed return precautions.  Voiced understanding.  Agreeable for follow-up  The patient has been appropriately medically screened and/or stabilized in the ED. I have low suspicion for any other emergent medical condition which would require further screening, evaluation or treatment in the ED or require inpatient management.  Patient is hemodynamically stable and in no acute distress.  Patient able to ambulate in department prior to ED.  Evaluation does not show acute pathology that would require ongoing or additional emergent interventions while in the emergency  department or further inpatient treatment.  I have discussed the diagnosis with the patient and answered all questions.  Pain is been managed while in the emergency department and patient has no further complaints prior to discharge.  Patient is comfortable with plan discussed in room and is stable for discharge at this time.  I have discussed strict return precautions for returning to the emergency department.  Patient was encouraged to follow-up with PCP/specialist refer to at discharge.  MDM Rules/Calculators/A&P                            Final Clinical Impression(s) / ED Diagnoses Final diagnoses:  Wheeze  Bronchitis  Elevated LFTs    Rx / DC Orders ED Discharge Orders          Ordered    doxycycline (VIBRAMYCIN) 100 MG capsule  2 times daily        10/11/21 1312    albuterol (VENTOLIN HFA) 108 (90 Base) MCG/ACT inhaler  Every 6 hours PRN        10/11/21 1312    ipratropium (ATROVENT HFA) 17 MCG/ACT inhaler  Every 4 hours PRN        10/11/21 1312    predniSONE (DELTASONE) 50 MG tablet  Daily        10/11/21 1312             Ramatoulaye Pack A, PA-C 10/11/21 1326    Cur[place]olo, Adam, DO 10/11/21 1503

## 2021-10-11 NOTE — ED Notes (Signed)
Ambulated from lobby to room 6, SpO2 96-100 on r/a, HR 100-110, audible wheezes, + DOE.

## 2021-10-11 NOTE — ED Triage Notes (Signed)
Shortness of breath x 3 days , lethargy , nausea , diarrhea. Worse shortness of breath yesterday with minimal exertion.  Alert and oriented x 4 Denies chest pain

## 2022-06-08 IMAGING — DX DG CHEST 1V PORT
1 series · 1 of 1 positions shown · non-contrast
Comparison: 09/25/2019 chest radiograph.

CLINICAL DATA: Cough and dyspnea

EXAM:
PORTABLE CHEST 1 VIEW

[chest ap]
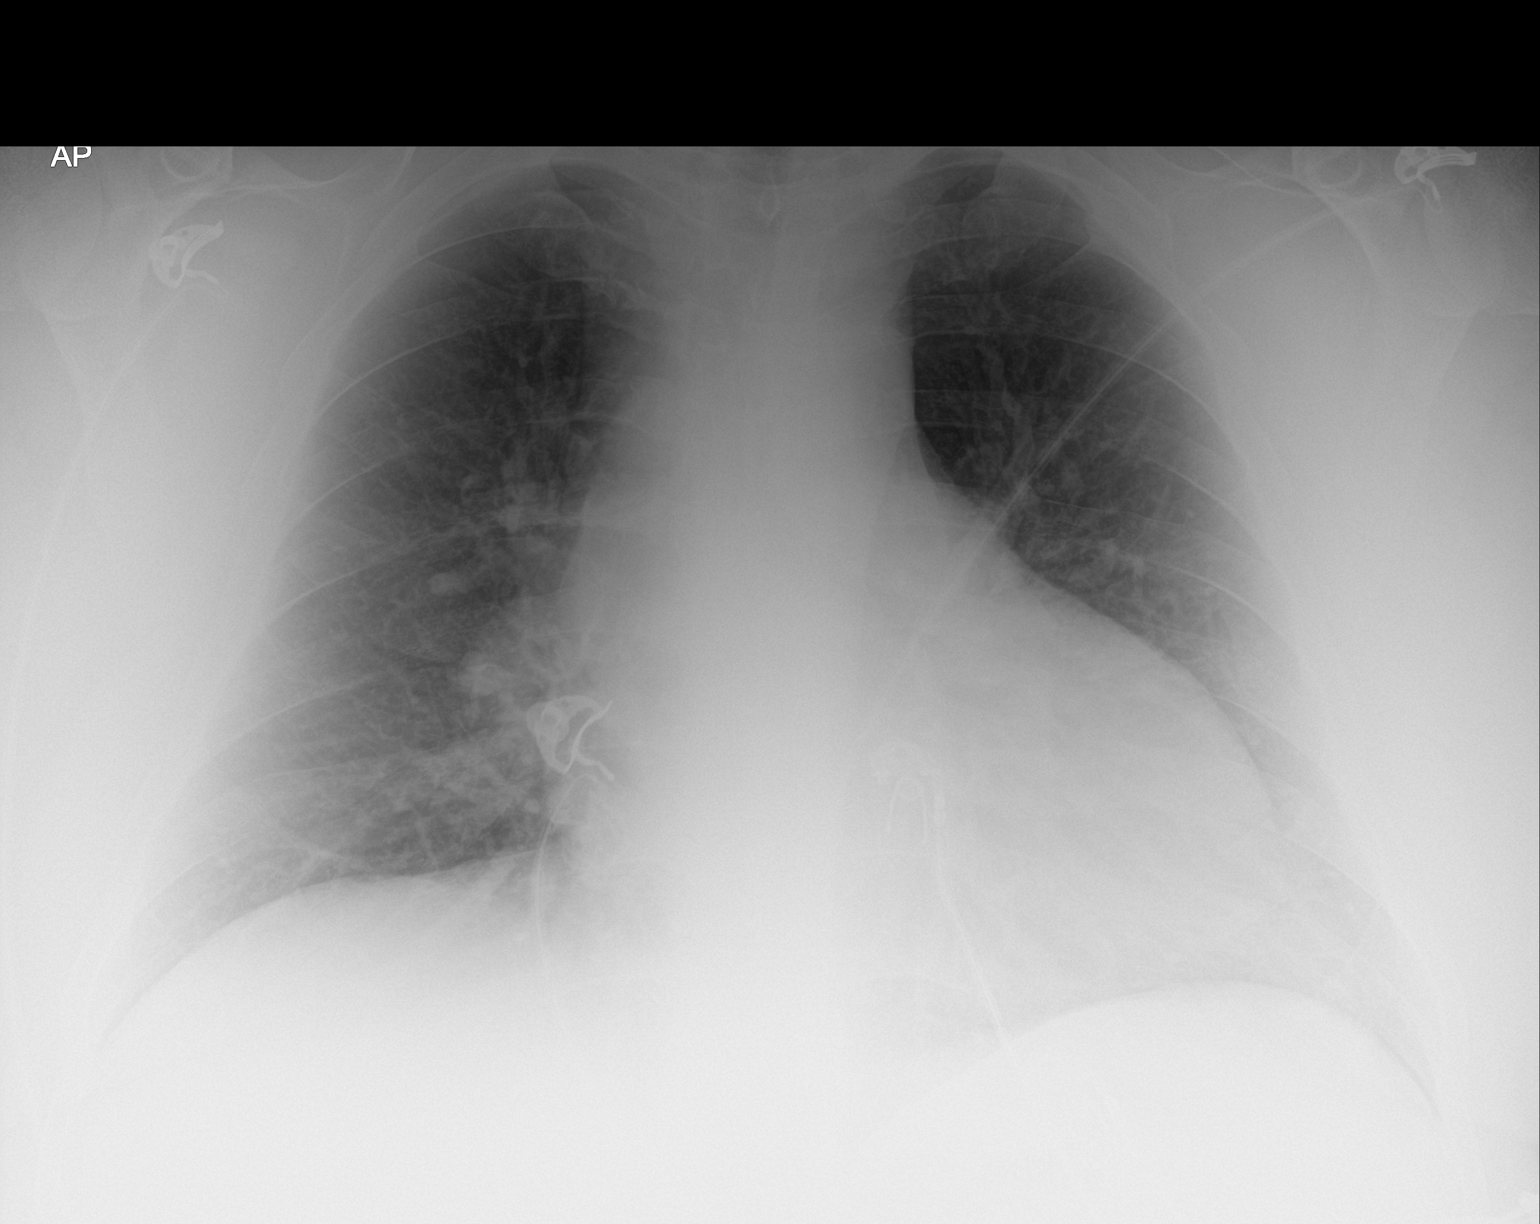

[1 of 1 positions shown; findings below may reference images not displayed]

FINDINGS: Stable cardiomediastinal silhouette with mild cardiomegaly. No
pneumothorax. No pleural effusion. No overt pulmonary edema. No
acute consolidative airspace disease.
IMPRESSION: Stable mild cardiomegaly without overt pulmonary edema. No active
pulmonary disease.
